# Patient Record
Sex: Male | Born: 1975 | Race: Black or African American | Hispanic: No | Marital: Married
Health system: Southern US, Community
[De-identification: ages and names within clinical notes are randomized; demographics above are authoritative.]

---

## 2000-05-22 ENCOUNTER — Emergency Department (HOSPITAL_COMMUNITY): Admission: EM | Admit: 2000-05-22 | Discharge: 2000-05-22 | Payer: Self-pay | Admitting: Emergency Medicine

## 2000-05-22 ENCOUNTER — Encounter: Payer: Self-pay | Admitting: Emergency Medicine

## 2005-05-30 ENCOUNTER — Emergency Department (HOSPITAL_COMMUNITY): Admission: EM | Admit: 2005-05-30 | Discharge: 2005-05-30 | Payer: Self-pay | Admitting: Emergency Medicine

## 2005-06-29 ENCOUNTER — Emergency Department (HOSPITAL_COMMUNITY): Admission: EM | Admit: 2005-06-29 | Discharge: 2005-06-30 | Payer: Self-pay | Admitting: Emergency Medicine

## 2007-11-21 ENCOUNTER — Emergency Department (HOSPITAL_COMMUNITY): Admission: EM | Admit: 2007-11-21 | Discharge: 2007-11-21 | Payer: Self-pay | Admitting: Emergency Medicine

## 2007-11-24 ENCOUNTER — Inpatient Hospital Stay (HOSPITAL_COMMUNITY): Admission: EM | Admit: 2007-11-24 | Discharge: 2007-11-28 | Payer: Self-pay | Admitting: Emergency Medicine

## 2007-11-24 ENCOUNTER — Encounter (INDEPENDENT_AMBULATORY_CARE_PROVIDER_SITE_OTHER): Payer: Self-pay | Admitting: Orthopedic Surgery

## 2007-11-25 ENCOUNTER — Encounter (INDEPENDENT_AMBULATORY_CARE_PROVIDER_SITE_OTHER): Payer: Self-pay | Admitting: Internal Medicine
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
## 2008-06-09 IMAGING — CR DG CHEST 2V
2 series · 2 of 2 positions shown · non-contrast
Comparison: 11/24/07

CLINICAL DATA: 32-year-old male, preoperative for surgery.  Hemoptysis.  
 CHEST ? 2 VIEW:

[w chest pa]
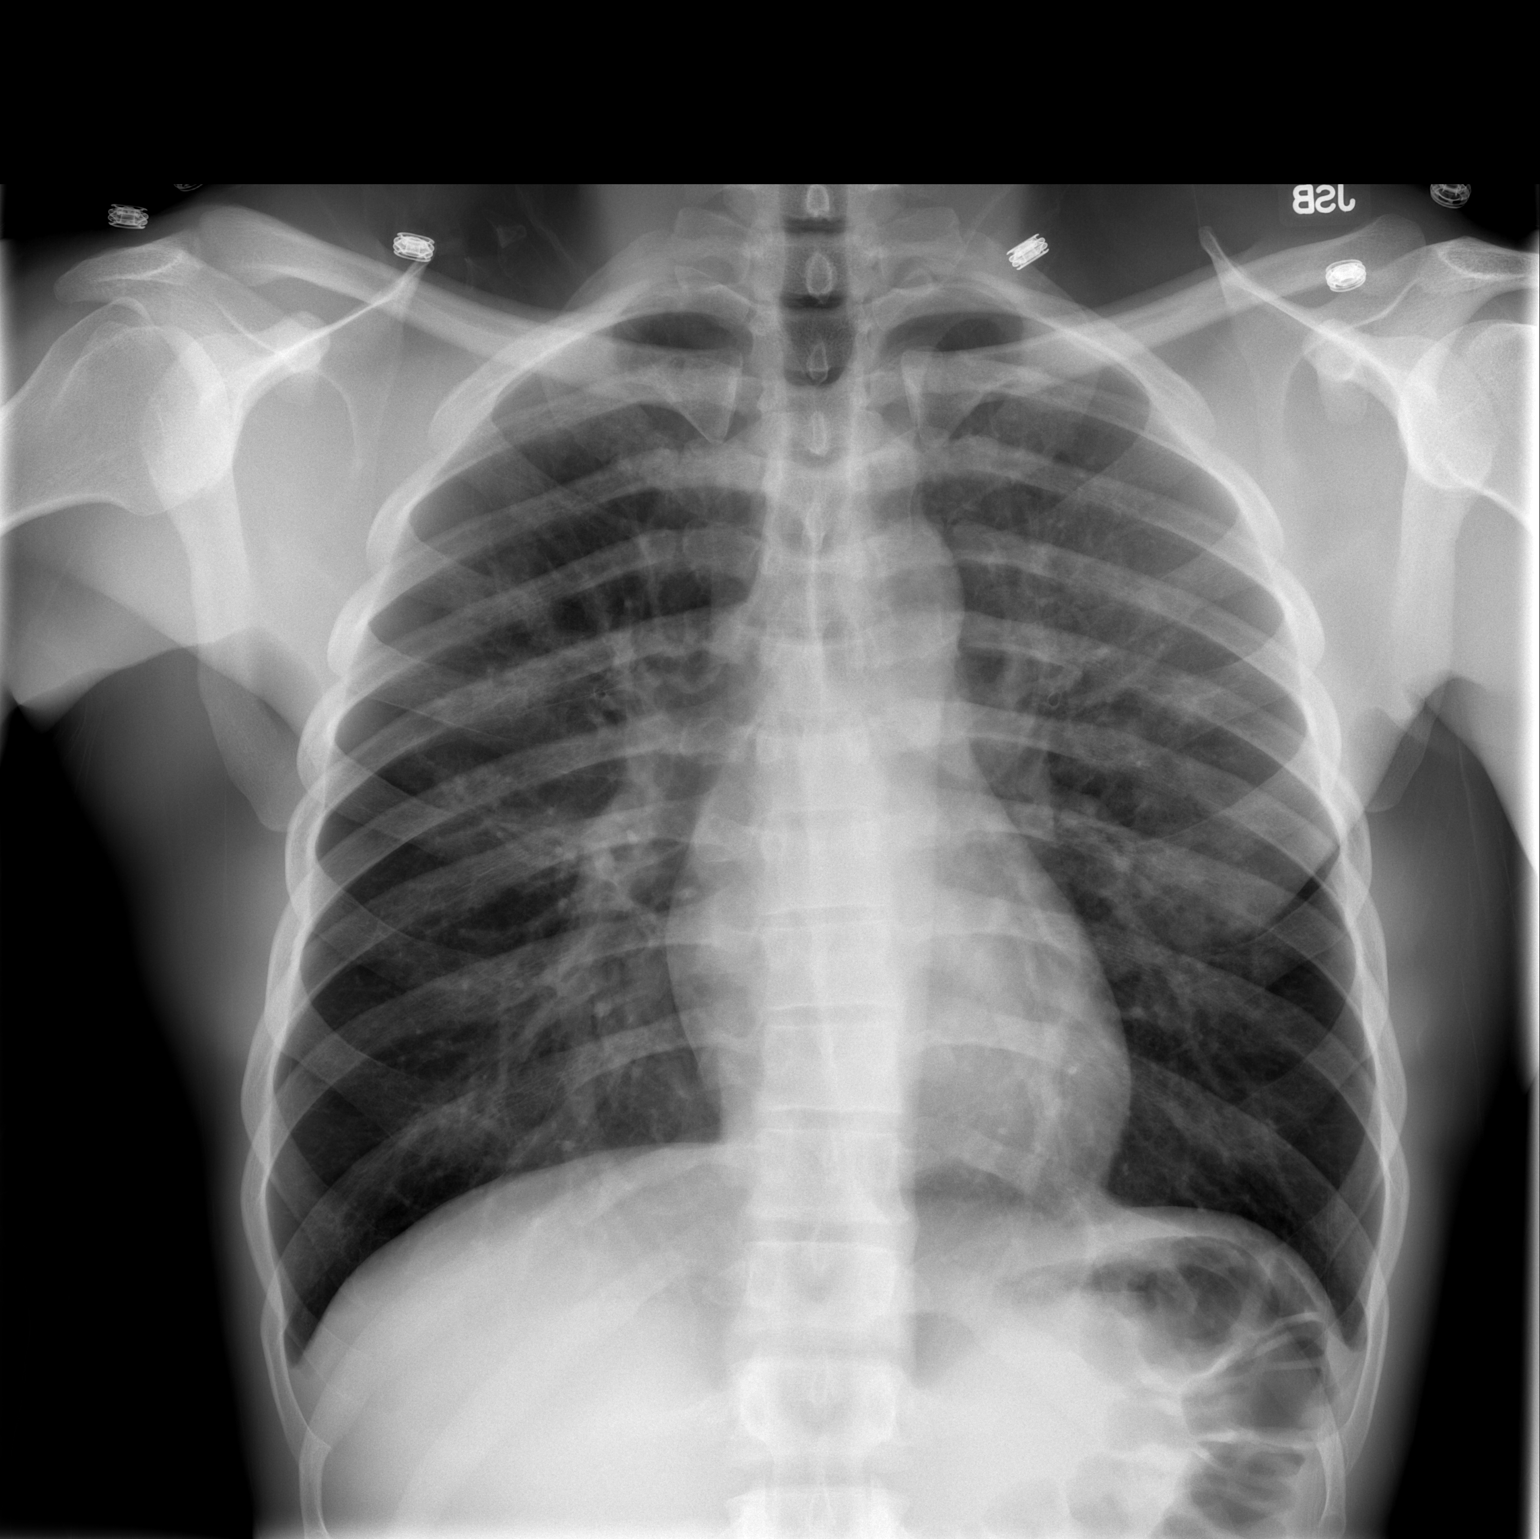

[w chest lat]
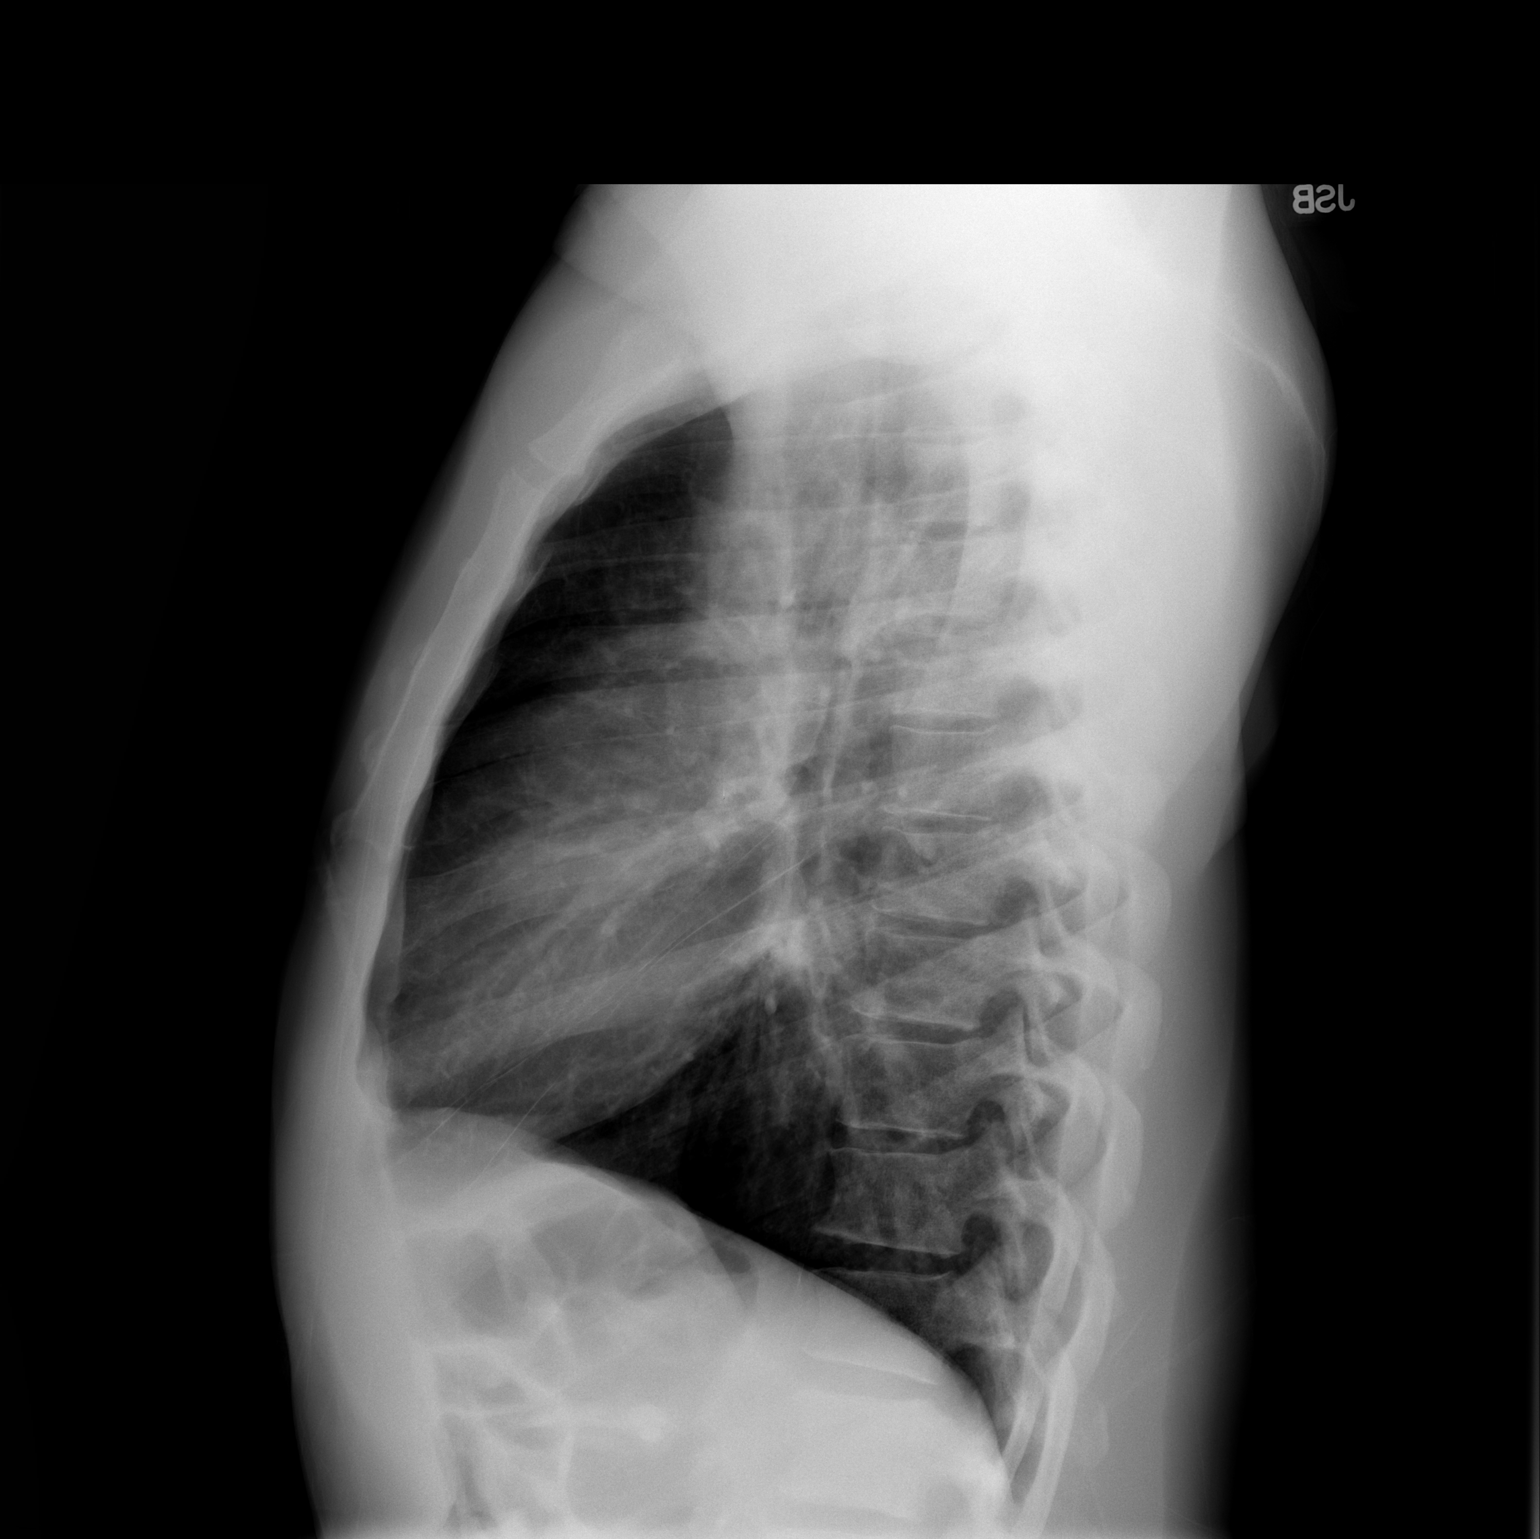

[2 of 2 positions shown; findings below may reference images not displayed]

FINDINGS: Cardiac size and mediastinal contour are normal.  No pneumothorax or pleural effusion.  No consolidation.  Decreased ill-defined perihilar opacities, still faintly present left greater than right.  These might reflect pulmonary edema or areas of resolving pulmonary hemorrhage in light of history.  Probable congenital wedging of lower thoracic vertebra.  No acute osseous abnormality.
IMPRESSION: 1.  Some resolution of previously seen perihilar opacities suspected, still faintly visible left greater than right.  Differential considerations include edema or hemorrhage in light of reported hemoptysis.  
 2.  No new abnormality seen.
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
## 2011-01-30 NOTE — Op Note (Signed)
NAME:  Christian Fitzpatrick, Christian Fitzpatrick           ACCOUNT NO.:  [id number]   MEDICAL RECORD NO.:  [id number]          PATIENT TYPE:  INP   LOCATION:  5034                         FACILITY:  [facility]   PHYSICIAN:  Artist Pais. Weingold, M.D.DATE OF BIRTH:  1975/11/14   DATE OF PROCEDURE:  11/26/2007  DATE OF DISCHARGE:                               OPERATIVE REPORT   PREOPERATIVE DIAGNOSIS:  Right hand infection.   POSTOPERATIVE DIAGNOSIS:  Right hand infection.   PROCEDURE:  Repeat incision and drainage of the above.   SURGEON:  Artist Pais. Mina Marble, M.D.   ASSISTANT:  None.   ANESTHESIA:  General.   TOURNIQUET TIME:  21 minutes.   COMPLICATIONS:  None.   DRAINS:  None, wound packed open.   OPERATIVE REPORT:  The patient was taken to the operating suite.  After  the induction of adequate general anesthesia, right upper extremity was  prepped and draped in usual sterile fashion.  The packing that had been  previously placed was removed.  Sutures were removed.  The wound was  opened.  There were significant signs of devitalized tissue and a small  bit of the purulence.  This was carefully debrided and irrigated using a  rongeur, tenotomy scissors and then 3 liters of normal saline under  pulse lavage.  Once this was completed, the wound was packed open again  with quarter inch Iodoform gauze, loosely closed with 4-0 nylon.  4x4s  fluffs, compressed dressing was applied.  The patient tolerated the  procedure well and went to recovery in stable fashion.      Artist Pais Mina Marble, M.D.  Electronically Signed     MAW/MEDQ  D:  11/26/2007  T:  11/27/2007  Job:  [id number]
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
## 2011-01-30 NOTE — Op Note (Signed)
NAME:  Christian Fitzpatrick, Christian Fitzpatrick               ACCOUNT NO.:  [id number]   MEDICAL RECORD NO.:  [id number]          PATIENT TYPE:  INP   LOCATION:  5034                         FACILITY:  [facility]   PHYSICIAN:  Artist Pais. Weingold, M.D.DATE OF BIRTH:  12-Jun-1976   DATE OF PROCEDURE:  11/28/2007  DATE OF DISCHARGE:  11/28/2007                               OPERATIVE REPORT   PREOPERATIVE DIAGNOSIS:  Chronic infection right hand.   POSTOPERATIVE DIAGNOSIS:  Chronic infection right hand.   PROCEDURE:  Incision and drainage repeat with secondary wound closure  right hand.   SURGEON:  Artist Pais. Mina Marble, M.D.   ASSISTANT:  None.   ANESTHESIA:  General.   TOURNIQUET:  None.   COMPLICATIONS:  No complication.   DRAINS:  None.   PROCEDURE IN DETAIL:  The patient was taken to the operating suite after  induction of adequate general anesthesia.  The right upper extremity was  prepped and draped in sterile fashion.  The packing that was placed in  the wound as previous washout 48 hours ago was removed.  The wound was  then irrigated and debrided with 3 liters of normal saline using pulse  lavage.  There was minimal devitalized tissue and this was carefully  removed using tenotomy scissors and pickups.  After this was done the  wound was loosely closed with 3-0 nylon.  It was then placed in a  sterile dressing of Adaptic, 4x4s, fluffs and compressive wrap.  The  patient tolerated the procedure well and went to recovery in stable  fashion.      Artist Pais Mina Marble, M.D.  Electronically Signed     MAW/MEDQ  D:  11/28/2007  T:  11/29/2007  Job:  [id number]
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
## 2011-06-11 LAB — GRAM STAIN

## 2011-06-11 LAB — COMPREHENSIVE METABOLIC PANEL
ALT: 36
AST: 33
AST: 44 — ABNORMAL HIGH
Albumin: 3.4 — ABNORMAL LOW
Alkaline Phosphatase: 71
BUN: 9
CO2: 21
CO2: 25
Calcium: 8.8
Chloride: 103
Chloride: 98
Creatinine, Ser: 1.18
Creatinine, Ser: 1.25
GFR calc Af Amer: >60
GFR calc Af Amer: >60
GFR calc non Af Amer: >60
GFR calc non Af Amer: >60
Glucose, Bld: 86
Potassium: 3.8
Sodium: 134 — ABNORMAL LOW
Total Bilirubin: 1.4 — ABNORMAL HIGH
Total Bilirubin: 1.4 — ABNORMAL HIGH
Total Protein: 6.8

## 2011-06-11 LAB — I-STAT 8, (EC8 V) (CONVERTED LAB)
BUN: 9
Chloride: 102
Glucose, Bld: 83
HCT: 50
pCO2, Ven: 44.2 — ABNORMAL LOW
pH, Ven: 7.394 — ABNORMAL HIGH

## 2011-06-11 LAB — BASIC METABOLIC PANEL
BUN: 7
CO2: 26
Calcium: 8.9
Chloride: 103
Creatinine, Ser: 1.23
GFR calc Af Amer: >60
GFR calc non Af Amer: >60
Glucose, Bld: 91
Potassium: 3.6
Sodium: 135

## 2011-06-11 LAB — ANAEROBIC CULTURE

## 2011-06-11 LAB — CBC
HCT: 42.5
HCT: 43.6
Hemoglobin: 14.5
Hemoglobin: 14.8
Hemoglobin: 15.1
MCHC: 33.8
MCHC: 33.8
MCHC: 34.4
MCV: 93.7
MCV: 93.8
MCV: 94.1
MCV: 94.5
Platelets: 263
RBC: 4.47
RBC: 4.49
RBC: 4.65
RBC: 4.78
RDW: 14.2
RDW: 14.5
WBC: 11.7 — ABNORMAL HIGH
WBC: 18.5 — ABNORMAL HIGH

## 2011-06-11 LAB — PROTIME-INR
INR: 1.2
Prothrombin Time: 15.4 — ABNORMAL HIGH

## 2011-06-11 LAB — WOUND CULTURE: Culture: NO GROWTH

## 2011-06-11 LAB — DIFFERENTIAL
Basophils Relative: 0
Eosinophils Absolute: 0
Eosinophils Relative: 0
Monocytes Absolute: 1.3 — ABNORMAL HIGH
Monocytes Relative: 10

## 2011-06-11 LAB — SEDIMENTATION RATE: Sed Rate: 47 — ABNORMAL HIGH

## 2022-08-26 ENCOUNTER — Emergency Department (HOSPITAL_COMMUNITY): Payer: Medicaid Other

## 2022-08-26 ENCOUNTER — Other Ambulatory Visit: Payer: Self-pay

## 2022-08-26 ENCOUNTER — Emergency Department (HOSPITAL_COMMUNITY)
Admission: EM | Admit: 2022-08-26 | Discharge: 2022-08-26 | Disposition: A | Discharge status: Home / Self Care | Payer: Medicaid Other | Attending: Emergency Medicine | Admitting: Emergency Medicine

## 2022-08-26 DIAGNOSIS — Z20822 Contact with and (suspected) exposure to covid-19: Secondary | ICD-10-CM | POA: Insufficient documentation

## 2022-08-26 DIAGNOSIS — J189 Pneumonia, unspecified organism: Secondary | ICD-10-CM

## 2022-08-26 DIAGNOSIS — R197 Diarrhea, unspecified: Secondary | ICD-10-CM | POA: Insufficient documentation

## 2022-08-26 DIAGNOSIS — R112 Nausea with vomiting, unspecified: Secondary | ICD-10-CM | POA: Insufficient documentation

## 2022-08-26 DIAGNOSIS — R0602 Shortness of breath: Secondary | ICD-10-CM | POA: Diagnosis present

## 2022-08-26 LAB — BASIC METABOLIC PANEL
Anion gap: 13 (ref 5–15)
BUN: 11 mg/dL (ref 6–20)
CO2: 23 mmol/L (ref 22–32)
Calcium: 9.1 mg/dL (ref 8.9–10.3)
Chloride: 98 mmol/L (ref 98–111)
Creatinine, Ser: 1.43 mg/dL — ABNORMAL HIGH (ref 0.61–1.24)
GFR, Estimated: >60 mL/min (ref >60)
Glucose, Bld: 82 mg/dL (ref 70–99)
Potassium: 4.7 mmol/L (ref 3.5–5.1)
Sodium: 134 mmol/L — ABNORMAL LOW (ref 135–145)

## 2022-08-26 LAB — CBC
HCT: 45 % (ref 39.0–52.0)
Hemoglobin: 15.6 g/dL (ref 13.0–17.0)
MCH: 31.7 pg (ref 26.0–34.0)
MCHC: 34.7 g/dL (ref 30.0–36.0)
MCV: 91.5 fL (ref 80.0–100.0)
Platelets: 420 10*3/uL — ABNORMAL HIGH (ref 150–400)
RBC: 4.92 MIL/uL (ref 4.22–5.81)
RDW: 13.5 % (ref 11.5–15.5)
WBC: 11.9 10*3/uL — ABNORMAL HIGH (ref 4.0–10.5)
nRBC: 0 % (ref 0.0–0.2)

## 2022-08-26 LAB — RESP PANEL BY RT-PCR (FLU A&B, COVID) ARPGX2
Influenza A by PCR: NEGATIVE
Influenza B by PCR: NEGATIVE
SARS Coronavirus 2 by RT PCR: NEGATIVE

## 2022-08-26 MED ORDER — DOXYCYCLINE HYCLATE 100 MG PO CAPS: 100.0000 mg | ORAL_CAPSULE | Freq: Two times a day (BID) | ORAL | 0 refills | Status: DC

## 2022-08-26 MED ORDER — SODIUM CHLORIDE 0.9 % IV BOLUS
500.0000 mL | Freq: Once | INTRAVENOUS | Status: AC
  Administered 2022-08-26: 500 mL via INTRAVENOUS

## 2022-08-26 MED ORDER — ACETAMINOPHEN 500 MG PO TABS
1000.0000 mg | ORAL_TABLET | Freq: Once | ORAL | Status: AC
  Administered 2022-08-26: 1000 mg via ORAL
  Filled 2022-08-26: qty 2

## 2022-08-26 MED ORDER — SODIUM CHLORIDE 0.9 % IV SOLN
1.0000 g | Freq: Once | INTRAVENOUS | Status: AC
  Administered 2022-08-26: 1 g via INTRAVENOUS
  Filled 2022-08-26: qty 10

## 2022-08-26 MED ORDER — ALBUTEROL SULFATE HFA 108 (90 BASE) MCG/ACT IN AERS: 2.0000 | INHALATION_SPRAY | RESPIRATORY_TRACT | Status: DC | PRN

## 2022-08-26 NOTE — ED Provider Triage Note (Signed)
Emergency Medicine Provider Triage Evaluation Note  Christian Fitzpatrick , a 46 y.o. male  was evaluated in triage.  Pt complains of bodyaches, chills, nausea and vomiting with diarrhea, fevers.  Patient reports this began on Tuesday.  Patient reports that he has tested negative for COVID-19 at home.  Patient denies sick contacts.  Patient denies flu vaccination this season.  Review of Systems  Positive:  Negative:   Physical Exam  BP (!) 148/90 (BP Location: Left Arm)   Pulse (!) 102   Temp (!) 100.4 F (38 C) (Oral)   Resp 18   Ht 5\' 6"  (1.676 m)   Wt 65.8 kg   SpO2 95%   BMI 23.40 kg/m  Gen:   Awake, no distress   Resp:  Normal effort  MSK:   Moves extremities without difficulty  Other:  Clear lung sounds bilaterally  Medical Decision Making  Medically screening exam initiated at 12:08 PM.  Appropriate orders placed.  KENNA KIRN was informed that the remainder of the evaluation will be completed by another provider, this initial triage assessment does not replace that evaluation, and the importance of remaining in the ED until their evaluation is complete.     Barnie Del, PA-C 08/26/22 1209

## 2022-08-26 NOTE — ED Triage Notes (Signed)
Pt reports shortness of breath, cough and chills x few days. Pt states he has inhalers at home but has not had any relief.

## 2022-08-26 NOTE — Discharge Instructions (Addendum)
Return to the ED with any new or worsening signs or symptoms Please read the attached guide concerning community-acquired pneumonia Please begin taking doxycycline twice daily for the next 10 days Please present to urgent care next week for repeat chest x-ray Please take Tylenol at home for fevers Please continue to push fluids.  Pedialyte, Body Armor.  Please eat high-protein low-fat diet.

## 2022-08-26 NOTE — ED Provider Notes (Signed)
[facility]-EMERGENCY DEPT Provider Note   CSN: [id number] Arrival date & time: 08/26/22  1036     History  Chief Complaint  Patient presents with   Shortness of Breath    Christian Fitzpatrick is a 46 y.o. male with no documented medical history. Patient complains of bodyaches, chills, nausea and vomiting with diarrhea, fevers.  Patient reports this began on Tuesday.  Patient reports that he has tested negative for COVID-19 at home.  Patient denies sick contacts.  Patient denies flu vaccination this season.  Patient denies trouble swallowing, chest pain.   Shortness of Breath Associated symptoms: fever and vomiting   Associated symptoms: no chest pain        Home Medications Prior to Admission medications   Medication Sig Start Date End Date Taking? Authorizing Provider  doxycycline (VIBRAMYCIN) 100 MG capsule Take 1 capsule (100 mg total) by mouth 2 (two) times daily. 08/26/22  Yes Al Decant, PA-C      Allergies    Patient has no allergy information on record.    Review of Systems   Review of Systems  Constitutional:  Positive for fever.  HENT:  Negative for trouble swallowing.   Respiratory:  Positive for shortness of breath.   Cardiovascular:  Negative for chest pain.  Gastrointestinal:  Positive for diarrhea, nausea and vomiting.  All other systems reviewed and are negative.   Physical Exam Updated Vital Signs BP (!) 155/90   Pulse 74   Temp 99.6 F (37.6 C) (Oral)   Resp 16   Ht 5\' 6"  (1.676 m)   Wt 65.8 kg   SpO2 98%   BMI 23.40 kg/m  Physical Exam Vitals and nursing note reviewed.  Constitutional:      General: He is not in acute distress.    Appearance: He is well-developed.  HENT:     Head: Normocephalic and atraumatic.     Nose: Nose normal.     Mouth/Throat:     Mouth: Mucous membranes are moist.     Pharynx: Oropharynx is clear.  Eyes:     Conjunctiva/sclera: Conjunctivae normal.  Cardiovascular:     Rate and  Rhythm: Normal rate and regular rhythm.     Heart sounds: No murmur heard. Pulmonary:     Effort: Pulmonary effort is normal. No respiratory distress.     Breath sounds: Normal breath sounds. No wheezing.  Abdominal:     Palpations: Abdomen is soft.     Tenderness: There is no abdominal tenderness.  Musculoskeletal:        General: No swelling.     Cervical back: Normal range of motion and neck supple. No rigidity or tenderness.  Skin:    General: Skin is warm and dry.     Capillary Refill: Capillary refill takes less than 2 seconds.  Neurological:     Mental Status: He is alert and oriented to person, place, and time.  Psychiatric:        Mood and Affect: Mood normal.     ED Results / Procedures / Treatments   Labs (all labs ordered are listed, but only abnormal results are displayed) Labs Reviewed  CBC - Abnormal; Notable for the following components:      Result Value   WBC 11.9 (*)    Platelets 420 (*)    All other components within normal limits  BASIC METABOLIC PANEL - Abnormal; Notable for the following components:   Sodium 134 (*)    Creatinine, Ser  1.43 (*)    All other components within normal limits  RESP PANEL BY RT-PCR (FLU A&B, COVID) [id number]    EKG None  Radiology DG Chest 2 View  Result Date: 08/26/2022 CLINICAL DATA:  Shortness of breath and cough.  Chills. EXAM: CHEST - 2 VIEW COMPARISON:  11/26/2007 FINDINGS: Heart size and mediastinal contours are unremarkable. Bilateral lower lobe airspace opacities are identified concerning for multifocal pneumonia. Mild central airway thickening. No pleural fluid or interstitial edema. Visualized osseous structures are unremarkable. IMPRESSION: Bilateral lower lobe airspace opacities concerning for multifocal pneumonia. Electronically Signed   By: Kerby Moors M.D.   On: 08/26/2022 11:22    Procedures Procedures   Medications Ordered in ED Medications  acetaminophen (TYLENOL) tablet 1,000 mg (1,000 mg Oral  Given 08/26/22 1251)  sodium chloride 0.9 % bolus 500 mL (0 mLs Intravenous Stopped 08/26/22 1400)  cefTRIAXone (ROCEPHIN) 1 g in sodium chloride 0.9 % 100 mL IVPB (0 g Intravenous Stopped 08/26/22 1400)    ED Course/ Medical Decision Making/ A&P                           Medical Decision Making Amount and/or Complexity of Data Reviewed Labs: ordered. Radiology: ordered.  Risk OTC drugs.   46 year old male presents ED for evaluation.  Please see HPI for further details.  On initial examination patient is febrile, tachycardic.  Patient lung sounds are clear bilaterally, he is not hypoxic on room air.  Patient abdomen soft and compressible throughout.  Patient neurological examination shows no focal neurodeficits.  Patient be worked up utilizing CBC, BMP, respiratory panel, chest x-ray, EKG.  Patient CBC significant for leukocytosis to 11.9.  The patient's BMP shows decreased sodium to 134, elevated creatinine 1.43.  We have no baseline creatinine to go off of, last creatinine for this patient was 13 years ago and was 1.23.  Will treat the patient with fluid.  Patient chest x-ray shows airspace opacities concerning for bilateral multifocal pneumonia.  The patient EKG is nonischemic.  Patient given 1 g Rocephin IV for multifocal pneumonia.  The patient was also provided with Tylenol for fever, fluid bolus of 500 mL.  The patient will be discharged home on doxycycline which he will take twice daily for the next 10 days.  I advised the patient to return to urgent care or this ED next week for repeat chest x-ray to ensure resolution of pneumonia.  The patient voices understanding of my instructions.  Patient and all his questions answered to satisfaction.  Return precautions provided and the patient voiced understanding.  The patient is stable for discharge.  Final Clinical Impression(s) / ED Diagnoses Final diagnoses:  Pneumonia of both lower lobes due to infectious organism    Rx / DC  Orders ED Discharge Orders          Ordered    doxycycline (VIBRAMYCIN) 100 MG capsule  2 times daily        08/26/22 1434              Azucena Cecil, [place] 08/26/22 1513    Tretha Sciara, MD 08/27/22 2309

## 2022-08-29 ENCOUNTER — Telehealth (HOSPITAL_COMMUNITY): Payer: Self-pay | Admitting: Emergency Medicine

## 2022-08-29 MED ORDER — LEVOFLOXACIN 500 MG PO TABS: 500.0000 mg | ORAL_TABLET | Freq: Every day | ORAL | 0 refills | Status: DC

## 2022-08-29 NOTE — Telephone Encounter (Signed)
Was notified by secretary that patient to call back to the ED stating that may be some allergic reaction to the antibiotic that they were started on for pneumonia.  Unable to follow-up with primary care doctor yet.  I reviewed chart and seems like maybe he was being treated with doxycycline for pneumonia.  Will switch this to Levaquin but I strongly encouraged that patient consider getting reevaluated in the ED if not feeling well.  Did not sound like patient was having any anaphylaxis symptoms.  Developed a small rash the last few days after starting the antibiotic.

## 2022-10-15 ENCOUNTER — Other Ambulatory Visit (HOSPITAL_COMMUNITY): Payer: Self-pay | Admitting: Internal Medicine

## 2022-10-15 DIAGNOSIS — I739 Peripheral vascular disease, unspecified: Secondary | ICD-10-CM

## 2022-10-22 ENCOUNTER — Encounter (HOSPITAL_COMMUNITY): Payer: Self-pay

## 2022-10-22 ENCOUNTER — Ambulatory Visit (HOSPITAL_COMMUNITY): Admission: RE | Admit: 2022-10-22 | Payer: Medicaid Other | Source: Ambulatory Visit

## 2022-11-13 ENCOUNTER — Ambulatory Visit (HOSPITAL_COMMUNITY)
Admission: RE | Admit: 2022-11-13 | Discharge: 2022-11-13 | Disposition: A | Discharge status: Home / Self Care | Payer: Medicaid Other | Source: Ambulatory Visit | Attending: Internal Medicine | Admitting: Internal Medicine

## 2022-11-13 DIAGNOSIS — I739 Peripheral vascular disease, unspecified: Secondary | ICD-10-CM | POA: Insufficient documentation

## 2022-11-13 LAB — VAS US ABI WITH/WO TBI
Left ABI: 1.2
Right ABI: 1.1

## 2024-08-15 ENCOUNTER — Emergency Department (HOSPITAL_COMMUNITY)

## 2024-08-15 ENCOUNTER — Other Ambulatory Visit: Payer: Self-pay

## 2024-08-15 ENCOUNTER — Encounter (HOSPITAL_COMMUNITY): Payer: Self-pay

## 2024-08-15 ENCOUNTER — Emergency Department (HOSPITAL_COMMUNITY)
Admission: EM | Admit: 2024-08-15 | Discharge: 2024-08-15 | Disposition: A | Discharge status: Home / Self Care | Attending: Emergency Medicine | Admitting: Emergency Medicine

## 2024-08-15 DIAGNOSIS — L03116 Cellulitis of left lower limb: Secondary | ICD-10-CM | POA: Insufficient documentation

## 2024-08-15 DIAGNOSIS — M7989 Other specified soft tissue disorders: Secondary | ICD-10-CM | POA: Diagnosis present

## 2024-08-15 LAB — CBC WITH DIFFERENTIAL/PLATELET
Abs Immature Granulocytes: 0.03 K/uL (ref 0.00–0.07)
Basophils Absolute: 0.1 K/uL (ref 0.0–0.1)
Basophils Relative: 1 %
Eosinophils Absolute: 0.1 K/uL (ref 0.0–0.5)
Eosinophils Relative: 1 %
HCT: 43.9 % (ref 39.0–52.0)
Hemoglobin: 15.1 g/dL (ref 13.0–17.0)
Immature Granulocytes: 0 %
Lymphocytes Relative: 21 %
Lymphs Abs: 2.4 K/uL (ref 0.7–4.0)
MCH: 32.2 pg (ref 26.0–34.0)
MCHC: 34.4 g/dL (ref 30.0–36.0)
MCV: 93.6 fL (ref 80.0–100.0)
Monocytes Absolute: 1 K/uL (ref 0.1–1.0)
Monocytes Relative: 9 %
Neutro Abs: 8 K/uL — ABNORMAL HIGH (ref 1.7–7.7)
Neutrophils Relative %: 68 %
Platelets: 354 K/uL (ref 150–400)
RBC: 4.69 MIL/uL (ref 4.22–5.81)
RDW: 13.8 % (ref 11.5–15.5)
WBC: 11.6 K/uL — ABNORMAL HIGH (ref 4.0–10.5)
nRBC: 0 % (ref 0.0–0.2)

## 2024-08-15 LAB — COMPREHENSIVE METABOLIC PANEL WITH GFR
ALT: 24 U/L (ref 0–44)
AST: 25 U/L (ref 15–41)
Albumin: 3.4 g/dL — ABNORMAL LOW (ref 3.5–5.0)
Alkaline Phosphatase: 78 U/L (ref 38–126)
Anion gap: 9 (ref 5–15)
BUN: 5 mg/dL — ABNORMAL LOW (ref 6–20)
CO2: 24 mmol/L (ref 22–32)
Calcium: 8.9 mg/dL (ref 8.9–10.3)
Chloride: 106 mmol/L (ref 98–111)
Creatinine, Ser: 0.87 mg/dL (ref 0.61–1.24)
GFR, Estimated: >60 mL/min (ref >60)
Glucose, Bld: 84 mg/dL (ref 70–99)
Potassium: 4 mmol/L (ref 3.5–5.1)
Sodium: 139 mmol/L (ref 135–145)
Total Bilirubin: 0.4 mg/dL (ref 0.0–1.2)
Total Protein: 6.1 g/dL — ABNORMAL LOW (ref 6.5–8.1)

## 2024-08-15 MED ORDER — SULFAMETHOXAZOLE-TRIMETHOPRIM 800-160 MG PO TABS: 1.0000 | ORAL_TABLET | Freq: Two times a day (BID) | ORAL | 0 refills | Status: AC

## 2024-08-15 MED ORDER — HYDROCODONE-ACETAMINOPHEN 5-325 MG PO TABS: 1.0000 | ORAL_TABLET | Freq: Four times a day (QID) | ORAL | 0 refills | Status: AC | PRN

## 2024-08-15 MED ORDER — CEPHALEXIN 500 MG PO CAPS
500.0000 mg | ORAL_CAPSULE | Freq: Once | ORAL | Status: AC
  Administered 2024-08-15: 500 mg via ORAL
  Filled 2024-08-15: qty 1

## 2024-08-15 MED ORDER — CEPHALEXIN 500 MG PO CAPS: 500.0000 mg | ORAL_CAPSULE | Freq: Two times a day (BID) | ORAL | 0 refills | Status: AC

## 2024-08-15 MED ORDER — HYDROCODONE-ACETAMINOPHEN 5-325 MG PO TABS
1.0000 | ORAL_TABLET | Freq: Once | ORAL | Status: AC
  Administered 2024-08-15: 1 via ORAL
  Filled 2024-08-15: qty 1

## 2024-08-15 MED ORDER — SULFAMETHOXAZOLE-TRIMETHOPRIM 800-160 MG PO TABS
1.0000 | ORAL_TABLET | Freq: Once | ORAL | Status: AC
  Administered 2024-08-15: 1 via ORAL
  Filled 2024-08-15: qty 1

## 2024-08-15 NOTE — ED Provider Notes (Signed)
 [facility] EMERGENCY DEPARTMENT AT [facility] Provider Note   CSN: [id number] Arrival date & time: 08/15/24  1300     Patient presents with: Foot Swelling   Christian Fitzpatrick is a 48 y.o. male.   HPI 48 year old male with a history of hyperlipidemia but not currently on any meds presents with left foot pain and swelling.  Symptoms started about 4 days ago.  There has been no trauma.  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  He had atraumatic swelling but then today noticed some drainage coming from between his 4th and 5th toes.  He has not had a fever.  The swelling is bad enough that it causes significant pain when he tries to walk.  The swelling goes to about his mid to proximal one third of his foot.  There is some redness to the skin that is new as well. Denies any known history of MRSA or HIV.  Prior to Admission medications   Medication Sig Start Date End Date Taking? Authorizing Provider  cephALEXin (KEFLEX) 500 MG capsule Take 1 capsule (500 mg total) by mouth 2 (two) times daily. 08/15/24  Yes Freddi Hamilton, MD  HYDROcodone-acetaminophen  (NORCO/VICODIN) 5-325 MG tablet Take 1 tablet by mouth every 6 (six) hours as needed for severe pain (pain score 7-10). 08/15/24  Yes Freddi Hamilton, MD  sulfamethoxazole-trimethoprim (BACTRIM DS) 800-160 MG tablet Take 1 tablet by mouth 2 (two) times daily for 7 days. 08/15/24 08/22/24 Yes Freddi Hamilton, MD    Allergies: Patient has no known allergies.    Review of Systems  Constitutional:  Negative for fever.  Musculoskeletal:  Positive for arthralgias and joint swelling.  Skin:  Positive for color change.    Updated Vital Signs BP (!) 152/83   Pulse 91   Temp 99.6 F (37.6 C) (Oral)   Resp 17   Ht 5' 6 (1.676 m)   Wt 66.7 kg   SpO2 96%   BMI 23.73 kg/m   Physical Exam Vitals and nursing note reviewed.  Constitutional:      Appearance: He is  well-developed.  HENT:     Head: Normocephalic and atraumatic.  Cardiovascular:     Rate and Rhythm: Normal rate and regular rhythm.     Pulses:          Dorsalis pedis pulses are 2+ on the left side.  Pulmonary:     Effort: Pulmonary effort is normal.  Musculoskeletal:     Left lower leg: No swelling or tenderness.     Left ankle: No tenderness. Normal range of motion.     Left foot: Swelling and tenderness present.     Comments: Patient has diffuse tenderness and swelling to the dorsal left foot.  There is some light erythema for about two thirds of his distal foot.  Between the 4th and 5th toes there is an area of skin breakdown with some dried drainage but there is no active drainage.  No fluctuance appreciated.  Skin:    General: Skin is warm and dry.  Neurological:     Mental Status: He is alert.     (all labs ordered are listed, but only abnormal results are displayed) Labs Reviewed  COMPREHENSIVE METABOLIC PANEL WITH GFR - Abnormal; Notable for the following components:      Result Value   BUN 5 (*)    Total Protein 6.1 (*)    Albumin 3.4 (*)  All other components within normal limits  CBC WITH DIFFERENTIAL/PLATELET - Abnormal; Notable for the following components:   WBC 11.6 (*)    Neutro Abs 8.0 (*)    All other components within normal limits    EKG: None  Radiology: DG Foot Complete Left Result Date: 08/15/2024 EXAM: 3 OR MORE VIEW(S) XRAY OF THE LEFT FOOT 08/15/2024 02:19:00 PM COMPARISON: None available. CLINICAL HISTORY: Foot swelling FINDINGS: BONES AND JOINTS: No acute fracture. No focal osseous lesion. No joint dislocation. SOFT TISSUES: The soft tissues are unremarkable. IMPRESSION: 1. No significant abnormality. Electronically signed by: Lynwood Seip MD 08/15/2024 02:36 PM EST RP Workstation: [id number]     Procedures   Medications Ordered in the ED  HYDROcodone-acetaminophen  (NORCO/VICODIN) 5-325 MG per tablet 1 tablet (1 tablet Oral Given  08/15/24 1512)  cephALEXin (KEFLEX) capsule 500 mg (500 mg Oral Given 08/15/24 1619)  sulfamethoxazole-trimethoprim (BACTRIM DS) 800-160 MG per tablet 1 tablet (1 tablet Oral Given 08/15/24 1619)                                    Medical Decision Making Amount and/or Complexity of Data Reviewed Labs: ordered.    Details: Mild leukocytosis, similar to prior values Radiology: ordered and independent interpretation performed.    Details: No foreign body or fracture  Risk Prescription drug management.   Patient presents with atraumatic left foot pain, appears to be from cellulitis.  While he does have some tenderness, there is no pain out of proportion or pain with rest.  I have a low suspicion for a deep space infection/necrotizing fasciitis.  Strong DP pulse.  He does have a chronic wound in between his 4th and 5th digits which is likely the source of his infection.  Given there has been drainage, will need to cover for MRSA, he also reports he recently was incarcerated.  Due to this, will cover with cephalexin and Bactrim as he is not on any chronic meds or has any obvious contraindication to Bactrim.  Renal function seems to be unremarkable and at baseline.  Otherwise, was given some hydrocodone for pain.  There is no foreign body or obvious fracture/obvious osteomyelitis on his x-ray.  I have low suspicion for osteo given the acute symptoms over just a few days.  Will treat with antibiotics and recommend he follow-up with a PCP.  He is a little hypertensive here but will need this rechecked as an outpatient.  Appears stable for discharge home with return precautions.  No signs of sepsis at this time.     Final diagnoses:  Cellulitis of left foot    ED Discharge Orders          Ordered    HYDROcodone-acetaminophen  (NORCO/VICODIN) 5-325 MG tablet  Every 6 hours PRN        08/15/24 1611    cephALEXin (KEFLEX) 500 MG capsule  2 times daily        08/15/24 1611     sulfamethoxazole-trimethoprim (BACTRIM DS) 800-160 MG tablet  2 times daily        08/15/24 1611               Freddi Hamilton, MD 08/15/24 1623

## 2024-08-15 NOTE — ED Triage Notes (Signed)
 Patient said for 4 days his left foot has been swollen. Denies stepping on anything. Said there is pus between his left pinky and 4th toe.

## 2024-08-15 NOTE — Discharge Instructions (Addendum)
 You appear to have a skin infection to your left foot.  We are giving you 2 different antibiotics to cover the most likely organisms responsible for this.  Be sure to elevate your leg whenever you are not walking or standing.  You were given the first dose of both antibiotics here.  We are also giving you hydrocodone  for severe or breakthrough pain, but she may also use ibuprofen and Tylenol  to help.  Do not drive or operate heavy machinery or combine the hydrocodone  with alcohol or other meds.  It is important to follow-up with her primary care provider.  Your blood pressure was elevated today, you will need to get this rechecked.  You also need to have the foot rechecked to make sure it is improving.  However, if you develop fever, new or worsening swelling, red streaking going up your leg, or any other new/concerning symptoms then return to the ER.
